# Patient Record
Sex: Female | Born: 1980 | Race: White | Hispanic: No | Marital: Married | Smoking: Never smoker
Health system: Southern US, Community
[De-identification: ages and names within clinical notes are randomized; demographics above are authoritative.]

## PROBLEM LIST (undated history)

## (undated) DIAGNOSIS — L100 Pemphigus vulgaris: Secondary | ICD-10-CM

## (undated) DIAGNOSIS — D849 Immunodeficiency, unspecified: Secondary | ICD-10-CM

## (undated) DIAGNOSIS — N289 Disorder of kidney and ureter, unspecified: Secondary | ICD-10-CM

## (undated) DIAGNOSIS — E119 Type 2 diabetes mellitus without complications: Secondary | ICD-10-CM

## (undated) HISTORY — PX: ABDOMINAL SURGERY: SHX537

## (undated) HISTORY — PX: CHOLECYSTECTOMY: SHX55

---

## 2018-12-31 ENCOUNTER — Encounter: Payer: Self-pay | Admitting: Emergency Medicine

## 2018-12-31 ENCOUNTER — Emergency Department (HOSPITAL_BASED_OUTPATIENT_CLINIC_OR_DEPARTMENT_OTHER)
Admission: EM | Admit: 2018-12-31 | Discharge: 2019-01-01 | Disposition: A | Discharge status: Home / Self Care | Payer: Self-pay | Attending: Emergency Medicine | Admitting: Emergency Medicine

## 2018-12-31 ENCOUNTER — Emergency Department (HOSPITAL_BASED_OUTPATIENT_CLINIC_OR_DEPARTMENT_OTHER): Payer: Self-pay

## 2018-12-31 DIAGNOSIS — E119 Type 2 diabetes mellitus without complications: Secondary | ICD-10-CM | POA: Insufficient documentation

## 2018-12-31 DIAGNOSIS — L03116 Cellulitis of left lower limb: Secondary | ICD-10-CM | POA: Insufficient documentation

## 2018-12-31 DIAGNOSIS — R609 Edema, unspecified: Secondary | ICD-10-CM

## 2018-12-31 DIAGNOSIS — Z79899 Other long term (current) drug therapy: Secondary | ICD-10-CM | POA: Insufficient documentation

## 2018-12-31 HISTORY — DX: Pemphigus vulgaris: L10.0

## 2018-12-31 HISTORY — DX: Type 2 diabetes mellitus without complications: E11.9

## 2018-12-31 HISTORY — DX: Immunodeficiency, unspecified: D84.9

## 2018-12-31 HISTORY — DX: Disorder of kidney and ureter, unspecified: N28.9

## 2018-12-31 LAB — CBC WITH DIFFERENTIAL/PLATELET
Abs Immature Granulocytes: 0.01 10*3/uL (ref 0.00–0.07)
Basophils Absolute: 0.1 10*3/uL (ref 0.0–0.1)
Basophils Relative: 1 %
Eosinophils Absolute: 0 10*3/uL (ref 0.0–0.5)
Eosinophils Relative: 0 %
HCT: 37 % (ref 36.0–46.0)
Hemoglobin: 12.6 g/dL (ref 12.0–15.0)
Immature Granulocytes: 0 %
Lymphocytes Relative: 38 %
Lymphs Abs: 2.6 10*3/uL (ref 0.7–4.0)
MCH: 29.6 pg (ref 26.0–34.0)
MCHC: 34.1 g/dL (ref 30.0–36.0)
MCV: 87.1 fL (ref 80.0–100.0)
Monocytes Absolute: 0.7 10*3/uL (ref 0.1–1.0)
Monocytes Relative: 10 %
Neutro Abs: 3.6 10*3/uL (ref 1.7–7.7)
Neutrophils Relative %: 51 %
Platelets: 356 10*3/uL (ref 150–400)
RBC: 4.25 MIL/uL (ref 3.87–5.11)
RDW: 12.1 % (ref 11.5–15.5)
WBC: 7 10*3/uL (ref 4.0–10.5)
nRBC: 0 % (ref 0.0–0.2)

## 2018-12-31 LAB — BASIC METABOLIC PANEL
Anion gap: 10 (ref 5–15)
BUN: 23 mg/dL — ABNORMAL HIGH (ref 6–20)
CO2: 22 mmol/L (ref 22–32)
Calcium: 8.7 mg/dL — ABNORMAL LOW (ref 8.9–10.3)
Chloride: 104 mmol/L (ref 98–111)
Creatinine, Ser: 0.76 mg/dL (ref 0.44–1.00)
GFR calc Af Amer: >60 mL/min (ref >60)
GFR calc non Af Amer: >60 mL/min (ref >60)
Glucose, Bld: 108 mg/dL — ABNORMAL HIGH (ref 70–99)
Potassium: 3.8 mmol/L (ref 3.5–5.1)
Sodium: 136 mmol/L (ref 135–145)

## 2018-12-31 MED ORDER — SULFAMETHOXAZOLE-TRIMETHOPRIM 800-160 MG PO TABS
1.0000 | ORAL_TABLET | Freq: Once | ORAL | Status: AC
  Administered 2018-12-31: 1 via ORAL
  Filled 2018-12-31: qty 1

## 2018-12-31 MED ORDER — DICLOFENAC SODIUM ER 100 MG PO TB24: 100.0000 mg | ORAL_TABLET | Freq: Every day | ORAL | 0 refills | Status: AC

## 2018-12-31 MED ORDER — ENOXAPARIN SODIUM 100 MG/ML ~~LOC~~ SOLN
1.0000 mg/kg | Freq: Once | SUBCUTANEOUS | Status: AC
  Administered 2018-12-31: 85 mg via SUBCUTANEOUS
  Filled 2018-12-31: qty 1

## 2018-12-31 MED ORDER — KETOROLAC TROMETHAMINE 30 MG/ML IJ SOLN
15.0000 mg | Freq: Once | INTRAMUSCULAR | Status: AC
  Administered 2018-12-31: 15 mg via INTRAVENOUS
  Filled 2018-12-31: qty 1

## 2018-12-31 MED ORDER — SULFAMETHOXAZOLE-TRIMETHOPRIM 800-160 MG PO TABS: 1.0000 | ORAL_TABLET | Freq: Two times a day (BID) | ORAL | 0 refills | Status: AC

## 2018-12-31 NOTE — ED Triage Notes (Addendum)
Patient states that she has a hx of cellulitis to her legs in the past - she is a diabetic. The patient states that she went to [facility] about 2 weeks ago and had a positive d - dimer and did not follow- up. The patient reports that she has had treatment for her bilateral leg swelling  - but they are swollen again. Patient has bilateral lower extremity swelling at this time. The patient denies any SOB

## 2018-12-31 NOTE — ED Notes (Signed)
X-ray at bedside

## 2019-01-01 ENCOUNTER — Encounter (HOSPITAL_BASED_OUTPATIENT_CLINIC_OR_DEPARTMENT_OTHER): Payer: Self-pay | Admitting: Emergency Medicine

## 2019-01-01 ENCOUNTER — Ambulatory Visit (HOSPITAL_BASED_OUTPATIENT_CLINIC_OR_DEPARTMENT_OTHER): Payer: Self-pay

## 2019-01-01 MED ORDER — FLUCONAZOLE 150 MG PO TABS: 150.0000 mg | ORAL_TABLET | Freq: Once | ORAL | 0 refills | Status: AC

## 2019-01-01 NOTE — ED Notes (Signed)
Rad tech at bedside to schedule pt for [place] tomorrow

## 2019-01-01 NOTE — ED Provider Notes (Signed)
[facility] EMERGENCY DEPARTMENT Provider Note   CSN: [id number] Arrival date & time: 12/31/18  2128     History   Chief Complaint Chief Complaint  Patient presents with  . Leg Swelling    HPI Brittany Chapman is a 38 y.o. female.     The history is provided by the patient.  Illness Location:  Bilateral lower extremities,  Quality:  Swelling B with redness on the dorsum of the left foot and over the left lateral malleolus Severity:  Moderate Onset quality:  Gradual Timing:  Constant Progression:  Waxing and waning Chronicity:  Recurrent Context:  Is on lasix at home.  Was seen on 11/21/18 at [facility] and told DDimer was elevated but no vascular studies were performed. Denies CP SOB, or DOE. No cough.  Relieved by:  Nothing.   Worsened by:  Unknown Ineffective treatments:  Lasix Associated symptoms: no abdominal pain, no chest pain, no congestion, no cough, no diarrhea, no fatigue, no fever, no loss of consciousness, no rhinorrhea, no shortness of breath and no wheezing   Patient with h/o peripheral edema on lasix presents with same.  Was seen in October for this and had an elevated Ddimer but no follow up DVT studies.  She has redness of the dorsum of the foot surrounding a tattoo and about the left lateral mallelous.   No trauma.  No CP, no SOB, no cough.  No f/c/r. No streaking up the legs.     Past Medical History:  Diagnosis Date  . Diabetes mellitus without complication (HCC)   . Immune deficiency disorder (HCC)   . Pemphigus vulgaris   . Renal disorder     There are no active problems to display for this patient.   Past Surgical History:  Procedure Laterality Date  . ABDOMINAL SURGERY    . CHOLECYSTECTOMY       OB History   No obstetric history on file.      Home Medications    Prior to Admission medications   Medication Sig Start Date End Date Taking? Authorizing Provider  carvedilol (COREG) 12.5 MG tablet Take by mouth.   Yes [provider]  furosemide (LASIX) 40 MG tablet Take 40 mg by mouth daily.  11/21/18  Yes [provider]  ipratropium-albuterol (DUONEB) 0.5-2.5 (3) MG/3ML SOLN Inhale into the lungs.   Yes [provider]  mycophenolate (CELLCEPT) 500 MG tablet Take 1,000 mg by mouth 2 (two) times daily.    Yes [provider]  PREDNISONE PO Take 40 mg by mouth 2 (two) times daily.    Yes [provider]  zolpidem (AMBIEN) 10 MG tablet Take by mouth. 02/10/13  Yes [provider]  Diclofenac Sodium CR 100 MG 24 hr tablet Take 1 tablet (100 mg total) by mouth daily. 12/31/18   Verle Wheeling, Deztiny, MD  fluconazole (DIFLUCAN) 150 MG tablet Take 1 tablet (150 mg total) by mouth once for 1 dose. 01/01/19 01/01/19  Rock Sobol, Melenda, MD  HYDROcodone-Chlorpheniramine 5-4 MG/5ML SOLN Take by mouth. 06/02/14   [provider]  sulfamethoxazole-trimethoprim (BACTRIM DS) 800-160 MG tablet Take 1 tablet by mouth 2 (two) times daily for 7 days. 12/31/18 01/07/19  Olyvia Gopal, Oberia, MD    Family History History reviewed. No pertinent family history.  Social History Social History   Tobacco Use  . Smoking status: Never Smoker  . Smokeless tobacco: Never Used  Substance Use Topics  . Alcohol use: Not Currently    Frequency: Never  Comment: occasional  . Drug use: Never     Allergies   Shellfish allergy   Review of Systems Review of Systems  Constitutional: Negative for fatigue and fever.  HENT: Negative for congestion and rhinorrhea.   Eyes: Negative for visual disturbance.  Respiratory: Negative for cough, shortness of breath and wheezing.   Cardiovascular: Positive for leg swelling. Negative for chest pain and palpitations.  Gastrointestinal: Negative for abdominal pain and diarrhea.  Genitourinary: Negative for difficulty urinating.  Musculoskeletal: Negative for arthralgias.  Skin: Positive for color change. Negative for wound.  Neurological: Negative for  dizziness and loss of consciousness.  Psychiatric/Behavioral: Negative for agitation.  All other systems reviewed and are negative.    Physical Exam Updated Vital Signs BP 104/78 (BP Location: Right Arm)   Pulse 90   Temp 98.7 F (37.1 C) (Oral)   Resp 18   Ht 5\' 6"  (1.676 m)   Wt 84.8 kg   LMP 12/23/2018   SpO2 100%   BMI 30.18 kg/m   Physical Exam Vitals signs and nursing note reviewed.  Constitutional:      General: She is not in acute distress.    Appearance: Normal appearance.  HENT:     Head: Normocephalic and atraumatic.     Nose: Nose normal.  Eyes:     Conjunctiva/sclera: Conjunctivae normal.     Pupils: Pupils are equal, round, and reactive to light.  Neck:     Musculoskeletal: Normal range of motion and neck supple.  Cardiovascular:     Rate and Rhythm: Normal rate and regular rhythm.     Pulses: Normal pulses.     Heart sounds: Normal heart sounds.  Pulmonary:     Effort: Pulmonary effort is normal.     Breath sounds: Normal breath sounds.  Abdominal:     General: Abdomen is flat. Bowel sounds are normal.     Tenderness: There is no abdominal tenderness. There is no guarding.  Musculoskeletal: Normal range of motion.        General: No tenderness.     Right knee: Normal.     Left knee: Normal.     Right ankle: She exhibits normal range of motion, no ecchymosis, no deformity, no laceration and normal pulse. No tenderness. Achilles tendon normal.     Left ankle: She exhibits normal range of motion, no ecchymosis, no deformity, no laceration and normal pulse. No tenderness. Achilles tendon normal.     Right lower leg: She exhibits no tenderness.     Left lower leg: She exhibits no tenderness.       Feet:     Comments: Pedal and ankle edema B, non pitting.  Soft.    Skin:    General: Skin is warm and dry.     Capillary Refill: Capillary refill takes less than 2 seconds.  Neurological:     General: No focal deficit present.     Mental Status: She is  alert and oriented to person, place, and time.  Psychiatric:        Mood and Affect: Mood normal.      ED Treatments / Results  Labs (all labs ordered are listed, but only abnormal results are displayed) Results for orders placed or performed during the hospital encounter of 12/31/18  Basic metabolic panel  Result Value Ref Range   Sodium 136 135 - 145 mmol/L   Potassium 3.8 3.5 - 5.1 mmol/L   Chloride 104 98 - 111 mmol/L   CO2 22 22 -  32 mmol/L   Glucose, Bld 108 (H) 70 - 99 mg/dL   BUN 23 (H) 6 - 20 mg/dL   Creatinine, Ser 5.40 0.44 - 1.00 mg/dL   Calcium 8.7 (L) 8.9 - 10.3 mg/dL   GFR calc non Af Amer >60 >60 mL/min   GFR calc Af Amer >60 >60 mL/min   Anion gap 10 5 - 15  CBC with Differential  Result Value Ref Range   WBC 7.0 4.0 - 10.5 K/uL   RBC 4.25 3.87 - 5.11 MIL/uL   Hemoglobin 12.6 12.0 - 15.0 g/dL   HCT 98.1 19.1 - 47.8 %   MCV 87.1 80.0 - 100.0 fL   MCH 29.6 26.0 - 34.0 pg   MCHC 34.1 30.0 - 36.0 g/dL   RDW 29.5 62.1 - 30.8 %   Platelets 356 150 - 400 K/uL   nRBC 0.0 0.0 - 0.2 %   Neutrophils Relative % 51 %   Neutro Abs 3.6 1.7 - 7.7 K/uL   Lymphocytes Relative 38 %   Lymphs Abs 2.6 0.7 - 4.0 K/uL   Monocytes Relative 10 %   Monocytes Absolute 0.7 0.1 - 1.0 K/uL   Eosinophils Relative 0 %   Eosinophils Absolute 0.0 0.0 - 0.5 K/uL   Basophils Relative 1 %   Basophils Absolute 0.1 0.0 - 0.1 K/uL   Immature Granulocytes 0 %   Abs Immature Granulocytes 0.01 0.00 - 0.07 K/uL   Dg Chest Portable 1 View  Result Date: 12/31/2018 CLINICAL DATA:  Leg swelling. EXAM: PORTABLE CHEST 1 VIEW COMPARISON:  None. FINDINGS: The cardiomediastinal contours are normal. The lungs are clear. Pulmonary vasculature is normal. No consolidation, pleural effusion, or pneumothorax. No acute osseous abnormalities are seen. IMPRESSION: Negative AP view of the chest. Electronically Signed   By: Narda Rutherford M.D.   On: 12/31/2018 23:48    Radiology Dg Chest Portable 1 View   Result Date: 12/31/2018 CLINICAL DATA:  Leg swelling. EXAM: PORTABLE CHEST 1 VIEW COMPARISON:  None. FINDINGS: The cardiomediastinal contours are normal. The lungs are clear. Pulmonary vasculature is normal. No consolidation, pleural effusion, or pneumothorax. No acute osseous abnormalities are seen. IMPRESSION: Negative AP view of the chest. Electronically Signed   By: Narda Rutherford M.D.   On: 12/31/2018 23:48    Procedures Procedures (including critical care time)  Medications Ordered in ED Medications  sulfamethoxazole-trimethoprim (BACTRIM DS) 800-160 MG per tablet 1 tablet (1 tablet Oral Given 12/31/18 2318)  enoxaparin (LOVENOX) injection 85 mg (85 mg Subcutaneous Given 12/31/18 2319)  ketorolac (TORADOL) 30 MG/ML injection 15 mg (15 mg Intravenous Given 12/31/18 2319)     Initial Impression / Assessment and Plan / ED Course  I am treating this patient for cellulitis.  Symptoms are mild but patient states she has a history of this and given the tattoo in that area I believe this is prudent.  Bactrim will cover for skin pathogens and is highly effective against MRSA.  Given the previously elevated D-dimer without follow up dopplers of the lower extremities I have given a dose of lovenox and have ordered dopplers of both lower extremities for the am. I do not believe this patient has a PE and the patient denies all symptoms of a PE and has normal respiratory rate and normal oxygen saturation and vitals signs.  This edema appears to be dependent in nature as it is non pitting and home lasix is not helping. Patient needs compression hose to the waist, I have written  a prescription for this. No signs of heart failure on chest xray nor renal dysfunction on lab work.  Patient is instruction to follow up with her family doctor for ongoing care.    Brittany Chapman was evaluated in Emergency Department on 01/01/2019 for the symptoms described in the history of present illness. She was evaluated in the  context of the global COVID-19 pandemic, which necessitated consideration that the patient might be at risk for infection with the SARS-CoV-2 virus that causes COVID-19. Institutional protocols and algorithms that pertain to the evaluation of patients at risk for COVID-19 are in a state of rapid change based on information released by regulatory bodies including the CDC and federal and state organizations. These policies and algorithms were followed during the patient's care in the ED.   Patient requested a prescription for anti fungal given that she is receiving antibiotics.  I have also prescribed this medication at the patient's request.      Final Clinical Impressions(s) / ED Diagnoses   Final diagnoses:  Cellulitis of left lower extremity   Return for intractable cough, coughing up blood,fevers >100.4 unrelieved by medication, shortness of breath, intractable vomiting, chest pain, shortness of breath, weakness,numbness, changes in speech, facial asymmetry,abdominal pain, passing out,Inability to tolerate liquids or food, cough, altered mental status or any concerns. No signs of systemic illness or infection. The patient is nontoxic-appearing on exam and vital signs are within normal limits.   I have reviewed the triage vital signs and the nursing notes. Pertinent labs &imaging results that were available during my care of the patient were reviewed by me and considered in my medical decision making (see chart for details).  After history, exam, and medical workup I feel the patient has been appropriately medically screened and is safe for discharge home. Pertinent diagnoses were discussed with the patient. Patient was given return precautions ED Discharge Orders         Ordered    fluconazole (DIFLUCAN) 150 MG tablet   Once     01/01/19 0022    sulfamethoxazole-trimethoprim (BACTRIM DS) 800-160 MG tablet  2 times daily     12/31/18 2341    Diclofenac Sodium CR 100 MG 24 hr  tablet  Daily     12/31/18 2341    US Venous Img Lower Unilateral Left  Status:  Canceled     12/31/18 2341    US Venous Img Lower Bilateral (DVT)     12/31/18 2341           Ryder Man, Jena, MD 01/01/19 3220

## 2019-01-01 NOTE — ED Notes (Signed)
Pt requested diflucan at d/c MD aware and orders received.

## 2019-01-07 ENCOUNTER — Emergency Department (HOSPITAL_BASED_OUTPATIENT_CLINIC_OR_DEPARTMENT_OTHER): Payer: Self-pay

## 2019-01-07 ENCOUNTER — Emergency Department (HOSPITAL_BASED_OUTPATIENT_CLINIC_OR_DEPARTMENT_OTHER)
Admission: EM | Admit: 2019-01-07 | Discharge: 2019-01-07 | Disposition: A | Discharge status: Home / Self Care | Payer: Self-pay | Attending: Emergency Medicine | Admitting: Emergency Medicine

## 2019-01-07 ENCOUNTER — Encounter (HOSPITAL_BASED_OUTPATIENT_CLINIC_OR_DEPARTMENT_OTHER): Payer: Self-pay | Admitting: Emergency Medicine

## 2019-01-07 ENCOUNTER — Other Ambulatory Visit: Payer: Self-pay

## 2019-01-07 DIAGNOSIS — R2243 Localized swelling, mass and lump, lower limb, bilateral: Secondary | ICD-10-CM | POA: Insufficient documentation

## 2019-01-07 DIAGNOSIS — E119 Type 2 diabetes mellitus without complications: Secondary | ICD-10-CM | POA: Insufficient documentation

## 2019-01-07 DIAGNOSIS — Z91013 Allergy to seafood: Secondary | ICD-10-CM | POA: Insufficient documentation

## 2019-01-07 DIAGNOSIS — Z79899 Other long term (current) drug therapy: Secondary | ICD-10-CM | POA: Insufficient documentation

## 2019-01-07 DIAGNOSIS — I509 Heart failure, unspecified: Secondary | ICD-10-CM

## 2019-01-07 DIAGNOSIS — M7989 Other specified soft tissue disorders: Secondary | ICD-10-CM

## 2019-01-07 DIAGNOSIS — Z7984 Long term (current) use of oral hypoglycemic drugs: Secondary | ICD-10-CM | POA: Insufficient documentation

## 2019-01-07 LAB — COMPREHENSIVE METABOLIC PANEL
ALT: 32 U/L (ref 0–44)
AST: 24 U/L (ref 15–41)
Albumin: 4 g/dL (ref 3.5–5.0)
Alkaline Phosphatase: 97 U/L (ref 38–126)
Anion gap: 9 (ref 5–15)
BUN: 15 mg/dL (ref 6–20)
CO2: 24 mmol/L (ref 22–32)
Calcium: 9 mg/dL (ref 8.9–10.3)
Chloride: 106 mmol/L (ref 98–111)
Creatinine, Ser: 0.7 mg/dL (ref 0.44–1.00)
GFR calc Af Amer: >60 mL/min (ref >60)
GFR calc non Af Amer: >60 mL/min (ref >60)
Glucose, Bld: 96 mg/dL (ref 70–99)
Potassium: 3.4 mmol/L — ABNORMAL LOW (ref 3.5–5.1)
Sodium: 139 mmol/L (ref 135–145)
Total Bilirubin: 0.9 mg/dL (ref 0.3–1.2)
Total Protein: 7.3 g/dL (ref 6.5–8.1)

## 2019-01-07 LAB — CBC WITH DIFFERENTIAL/PLATELET
Abs Immature Granulocytes: 0.02 10*3/uL (ref 0.00–0.07)
Basophils Absolute: 0 10*3/uL (ref 0.0–0.1)
Basophils Relative: 1 %
Eosinophils Absolute: 0 10*3/uL (ref 0.0–0.5)
Eosinophils Relative: 0 %
HCT: 38.1 % (ref 36.0–46.0)
Hemoglobin: 12.9 g/dL (ref 12.0–15.0)
Immature Granulocytes: 0 %
Lymphocytes Relative: 26 %
Lymphs Abs: 2.1 10*3/uL (ref 0.7–4.0)
MCH: 29.9 pg (ref 26.0–34.0)
MCHC: 33.9 g/dL (ref 30.0–36.0)
MCV: 88.2 fL (ref 80.0–100.0)
Monocytes Absolute: 0.7 10*3/uL (ref 0.1–1.0)
Monocytes Relative: 9 %
Neutro Abs: 5.1 10*3/uL (ref 1.7–7.7)
Neutrophils Relative %: 64 %
Platelets: 376 10*3/uL (ref 150–400)
RBC: 4.32 MIL/uL (ref 3.87–5.11)
RDW: 12.2 % (ref 11.5–15.5)
WBC: 7.9 10*3/uL (ref 4.0–10.5)
nRBC: 0 % (ref 0.0–0.2)

## 2019-01-07 LAB — URINALYSIS, ROUTINE W REFLEX MICROSCOPIC
Bilirubin Urine: NEGATIVE
Glucose, UA: NEGATIVE mg/dL
Hgb urine dipstick: NEGATIVE
Ketones, ur: 40 mg/dL — AB
Nitrite: NEGATIVE
Protein, ur: NEGATIVE mg/dL
Specific Gravity, Urine: 1.025 (ref 1.005–1.030)
pH: 7 (ref 5.0–8.0)

## 2019-01-07 LAB — URINALYSIS, MICROSCOPIC (REFLEX)

## 2019-01-07 LAB — BRAIN NATRIURETIC PEPTIDE: B Natriuretic Peptide: 23 pg/mL (ref 0.0–100.0)

## 2019-01-07 LAB — PREGNANCY, URINE: Preg Test, Ur: NEGATIVE

## 2019-01-07 MED ORDER — KETOROLAC TROMETHAMINE 15 MG/ML IJ SOLN
15.0000 mg | Freq: Once | INTRAMUSCULAR | Status: AC
  Administered 2019-01-07: 15 mg via INTRAMUSCULAR
  Filled 2019-01-07: qty 1

## 2019-01-07 MED ORDER — METHOCARBAMOL 500 MG PO TABS: 500.0000 mg | ORAL_TABLET | Freq: Two times a day (BID) | ORAL | 0 refills | Status: AC

## 2019-01-07 NOTE — ED Triage Notes (Signed)
Pt here with bilateral leg swelling that has gotten worse despite fluid restrictions and lasix adjustment.

## 2019-01-07 NOTE — ED Notes (Signed)
Attempted IV x 2 without success; another RN will attempt with [place], but that RN is in another procedure at this time. EDP aware.

## 2019-01-07 NOTE — ED Notes (Signed)
Patient transported to Ultrasound 

## 2019-01-07 NOTE — ED Notes (Signed)
Pt unable to provide urine at this time

## 2019-01-07 NOTE — Discharge Instructions (Addendum)
You have been diagnosed today with Leg Swelling.  At this time there does not appear to be the presence of an emergent medical condition, however there is always the potential for conditions to change. Please read and follow the below instructions.  Please return to the Emergency Department immediately for any new or worsening symptoms. Please be sure to follow up with your Primary Care Provider within one week regarding your visit today; please call their office to schedule an appointment even if you are feeling better for a follow-up visit.  You may call the [place] [facility] to schedule a follow-up appointment with a primary care provider if you do not already have one. You may use the muscle relaxer Robaxin as prescribed to help with your symptoms.  Do not drive or operate heavy machinery while taking Robaxin as it will make you drowsy.  Do not drink alcohol or take other sedating medications while taking Robaxin as this will worsen side effects. You have been given an NSAID-containing medication called Toradol today.  Do not take the medications including ibuprofen, Aleve, Advil, naproxen or other NSAID-containing medications for the next 2 days.  Please be sure to drink plenty of water over the next few days. Please use compression stockings and elevate your legs to help with your symptoms.  Get help right away if: You have shortness of breath or chest pain. You cannot breathe when you lie down. You have pain, redness, or warmth in the swollen areas. You have heart, liver, or kidney disease and get edema all of a sudden. You have a fever and your symptoms get worse all of a sudden. You have any new/concerning or worsening symptoms  Please read the additional information packets attached to your discharge summary.  Do not take your medicine if  develop an itchy rash, swelling in your mouth or lips, or difficulty breathing; call 911 and seek immediate emergency  medical attention if this occurs.  Note: Portions of this text may have been transcribed using voice recognition software. Every effort was made to ensure accuracy; however, inadvertent computerized transcription errors may still be present.

## 2019-01-07 NOTE — ED Provider Notes (Signed)
[facility] EMERGENCY DEPARTMENT Provider Note   CSN: [id number] Arrival date & time: 01/07/19  1203     History   Chief Complaint Chief Complaint  Patient presents with   Foot Swelling    HPI Brittany Chapman is a 38 y.o. female history of diabetes, immunodeficiency disorder, pemphigus vulgaris.  Patient presents today for chronic bilateral lower extremity swelling that has been worsening for 1 week.  She reports swelling has gradually worsened she reports that she has had pain to the bottoms of her feet since swelling began she describes a sharp throbbing sensation constant worsened with ambulation improved with rest and elevation and nonradiating.  Patient is concerned for heart failure, she has never been diagnosed with heart failure she would like to be evaluated for this today.  She reports that she has been taking Lasix 40 mg daily as well as her regular carvedilol.  She reports that these have not been helping her symptoms.  Patient was seen in this ER on 12/31/2018, at that time she was treated with Bactrim for concern of cellulitis, given a dose of Lovenox and scheduled for ultrasounds the following morning to evaluate for DVT.  Patient did not show up for her DVT studies.  Patient reports compliance with her Bactrim medication but reports symptoms have not improved.  Denies fever/chills, headache, chest pain/shortness of breath, abdominal pain, nausea/vomiting, hemoptysis, diarrhea, fall/injury, skin break, numbness/weakness, tingling or any additional concerns today.  Of note patient is requesting Toradol as she reports is the only medication that works for her leg pain.  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    HPI  Past Medical History:  Diagnosis Date   Diabetes mellitus without complication ([place])    Immune deficiency disorder ([place])    Pemphigus vulgaris    Renal disorder     There are  no active problems to display for this patient.   Past Surgical History:  Procedure Laterality Date   ABDOMINAL SURGERY     CHOLECYSTECTOMY       OB History   No obstetric history on file.      Home Medications    Prior to Admission medications   Medication Sig Start Date End Date Taking? Authorizing Provider  carvedilol (COREG) 12.5 MG tablet Take by mouth.    [provider]  Diclofenac Sodium CR 100 MG 24 hr tablet Take 1 tablet (100 mg total) by mouth daily. 12/31/18   Palumbo, Kanya, MD  furosemide (LASIX) 40 MG tablet Take 40 mg by mouth daily.  11/21/18   [provider]  HYDROcodone-Chlorpheniramine 5-4 MG/5ML SOLN Take by mouth. 06/02/14   [provider]  ipratropium-albuterol (DUONEB) 0.5-2.5 (3) MG/3ML SOLN Inhale into the lungs.    [provider]  methocarbamol (ROBAXIN) 500 MG tablet Take 1 tablet (500 mg total) by mouth 2 (two) times daily. 01/07/19   Nuala Alpha A, PA-C  mycophenolate (CELLCEPT) 500 MG tablet Take 1,000 mg by mouth 2 (two) times daily.     [provider]  PREDNISONE PO Take 40 mg by mouth 2 (two) times daily.     [provider]  sulfamethoxazole-trimethoprim (BACTRIM DS) 800-160 MG tablet Take 1 tablet by mouth 2 (two) times daily for 7 days. 12/31/18 01/07/19  Palumbo, Susie, MD  zolpidem (AMBIEN) 10 MG tablet Take by mouth. 02/10/13   [provider]    Family History History reviewed. No pertinent  family history.  Social History Social History   Tobacco Use   Smoking status: Never Smoker   Smokeless tobacco: Never Used  Substance Use Topics   Alcohol use: Not Currently    Frequency: Never    Comment: occasional   Drug use: Never     Allergies   Shellfish allergy   Review of Systems Review of Systems Ten systems are reviewed and are negative for acute change except as noted in the HPI   Physical Exam Updated Vital Signs BP 128/73 (BP Location: Right  Arm)    Pulse 86    Temp 98.7 F (37.1 C) (Oral)    Resp 15    LMP 12/23/2018    SpO2 100%   Physical Exam Constitutional:      General: She is not in acute distress.    Appearance: Normal appearance. She is well-developed. She is not ill-appearing or diaphoretic.  HENT:     Head: Normocephalic and atraumatic.     Right Ear: External ear normal.     Left Ear: External ear normal.     Nose: Nose normal.  Eyes:     General: Vision grossly intact. Gaze aligned appropriately.     Pupils: Pupils are equal, round, and reactive to light.  Neck:     Musculoskeletal: Normal range of motion.     Trachea: Trachea and phonation normal. No tracheal deviation.  Cardiovascular:     Rate and Rhythm: Normal rate and regular rhythm.     Pulses:          Dorsalis pedis pulses are 2+ on the right side and 2+ on the left side.     Heart sounds: Normal heart sounds.  Pulmonary:     Effort: Pulmonary effort is normal. No respiratory distress.  Abdominal:     General: There is no distension.     Palpations: Abdomen is soft.     Tenderness: There is no abdominal tenderness. There is no guarding or rebound.  Musculoskeletal: Normal range of motion.     Right lower leg: 1+ Edema present.     Left lower leg: 1+ Edema present.     Comments: No midline C/T/L spinal tenderness to palpation, no paraspinal muscle tenderness, no deformity, crepitus, or step-off noted. No sign of injury to the neck or back. - All major joints mobilized with appropriate range of motion and strength.  Feet:     Right foot:     Protective Sensation: 3 sites tested. 3 sites sensed.     Left foot:     Protective Sensation: 3 sites tested. 3 sites sensed.  Skin:    General: Skin is warm and dry.  Neurological:     Mental Status: She is alert.     GCS: GCS eye subscore is 4. GCS verbal subscore is 5. GCS motor subscore is 6.     Comments: Speech is clear and goal oriented, follows commands Major Cranial nerves without deficit,  no facial droop Normal strength in upper and lower extremities bilaterally including dorsiflexion and plantar flexion, strong and equal grip strength Sensation normal to light and sharp touch Moves extremities without ataxia, coordination intact Normal gait  Psychiatric:        Behavior: Behavior normal.    ED Treatments / Results  Labs (all labs ordered are listed, but only abnormal results are displayed) Labs Reviewed  COMPREHENSIVE METABOLIC PANEL - Abnormal; Notable for the following components:      Result Value   Potassium  3.4 (*)    All other components within normal limits  URINALYSIS, ROUTINE W REFLEX MICROSCOPIC - Abnormal; Notable for the following components:   APPearance CLOUDY (*)    Ketones, ur 40 (*)    Leukocytes,Ua LARGE (*)    All other components within normal limits  URINALYSIS, MICROSCOPIC (REFLEX) - Abnormal; Notable for the following components:   Bacteria, UA MANY (*)    All other components within normal limits  CBC WITH DIFFERENTIAL/PLATELET  BRAIN NATRIURETIC PEPTIDE  PREGNANCY, URINE    EKG EKG Interpretation  Date/Time:  Saturday January 07 2019 15:13:16 EST Ventricular Rate:  79 PR Interval:    QRS Duration: 98 QT Interval:  412 QTC Calculation: 473 R Axis:   105 Text Interpretation: Sinus rhythm Atrial premature complex Short PR interval Borderline right axis deviation Abnormal T, consider ischemia, lateral leads No old tracing to compare Confirmed by Rolan Bucco [phone]) on 01/07/2019 5:45:46 PM   Radiology Dg Chest 2 View  Result Date: 01/07/2019 CLINICAL DATA:  Pt with swelling bilateral legs x 2 weeks; nonsmoker; no c/o chest problems; no SOB; no cough; no chest pain EXAM: CHEST - 2 VIEW COMPARISON:  12/31/2018 FINDINGS: Lungs are clear. Heart size and mediastinal contours are within normal limits. No effusion. Visualized bones unremarkable. IMPRESSION: No acute cardiopulmonary disease. Electronically Signed   By: Corlis Leak M.D.    On: 01/07/2019 14:29   US Venous Img Lower Bilateral (dvt)  Result Date: 01/07/2019 CLINICAL DATA:  Leg swelling and pain x3 weeks EXAM: BILATERAL LOWER EXTREMITY VENOUS DOPPLER ULTRASOUND TECHNIQUE: Gray-scale sonography with compression, as well as color and duplex ultrasound, were performed to evaluate the deep venous system from the level of the common femoral vein through the popliteal and proximal calf veins. COMPARISON:  None FINDINGS: Normal compressibility of the common femoral, superficial femoral, and popliteal veins, as well as the proximal calf veins. No filling defects to suggest DVT on grayscale or color Doppler imaging. Doppler waveforms show normal direction of venous flow, normal respiratory phasicity and response to augmentation. Visualized segments of the saphenous venous systems normal in caliber and compressibility. Subcutaneous fat is heterogenous with patchy echogenic foci, nonspecific. IMPRESSION: No femoropopliteal and no calf DVT in the visualized calf veins. If clinical symptoms are inconsistent or if there are persistent or worsening symptoms, further imaging (possibly involving the iliac veins) may be warranted. Electronically Signed   By: Corlis Leak M.D.   On: 01/07/2019 14:23    Procedures Procedures (including critical care time)  Medications Ordered in ED Medications  ketorolac (TORADOL) 15 MG/ML injection 15 mg (15 mg Intramuscular Given 01/07/19 1543)     Initial Impression / Assessment and Plan / ED Course  I have reviewed the triage vital signs and the nursing notes.  Pertinent labs & imaging results that were available during my care of the patient were reviewed by me and considered in my medical decision making (see chart for details).    Well-appearing no acute distress, cranial nerves intact, no neuro deficits, no meningeal signs, heart regular rate and rhythm without murmur, lungs clear to auscultation bilaterally, abdomen soft nontender without  peritoneal signs, 1+ bilateral lower extremity edema equal without color change.  No skin break or sign of cellulitis.  Neurovascular intact to bilateral lower extremities, equal pedal pulses, sensation and capillary refill intact to all toes.  Range of motion intact without pain and with appropriate strength.  Patient had DVT study scheduled last week but did not show  for the appointment, will reorder today.  Additionally will obtain CBC, CMP, BNP, urinalysis and chest x-ray. - CBC within normal limits CMP potassium 3.4 otherwise within normal limits BNP within normal limit Urinalysis without protein, 40 ketones, many squamous cells, white blood cells, bacteria.  Suspect contaminated catch, patient without urinary symptoms no indication for ABX at this time. Pregnancy test negative CXR:    IMPRESSION:  No acute cardiopulmonary disease.   DVT US Bilateral:  IMPRESSION:  No femoropopliteal and no calf DVT in the visualized calf veins. If  clinical symptoms are inconsistent or if there are persistent or  worsening symptoms, further imaging (possibly involving the iliac  veins) may be warranted.   EKG: Sinus rhythm Atrial premature complex Short PR interval Borderline right axis deviation Abnormal T, consider ischemia, lateral leads No old tracing to compare Confirmed by Rolan BuccoBelfi, Melanie [phone] on 01/07/2019 5:45:46 PM - Patient has been ambulating in this emergency department without assistance or difficulty.  She is given 15 mg Toradol intramuscular per her request and reports great improvement of her symptoms.  Reassuring work-up as above, no evidence of heart failure today, patient has been encouraged to continue using compression hose and elevation to help with her symptoms.  She will be given referral to local PCP to establish care and for further evaluation.  There is no evidence for this to be DVT today, do not feel further imaging of the veins is warranted at this time.  She is  neurovascular intact bilateral lower extremities without evidence of infection, compartment syndrome or other acute pathologies.  She is well-appearing in no acute distress vital signs within normal limits, she is agreeable to discharge with outpatient PCP follow-up.  Patient has been given Robaxin for possible muscular etiology of her symptoms, she states of muscle extra precautions.  At this time there does not appear to be any evidence of an acute emergency medical condition and the patient appears stable for discharge with appropriate outpatient follow up. Diagnosis was discussed with patient who verbalizes understanding of care plan and is agreeable to discharge. I have discussed return precautions with patient who verbalizes understanding of return precautions. Patient encouraged to follow-up with their PCP. All questions answered.  Patient has been discharged in good condition.  Patient's case discussed with Dr. Fredderick PhenixBelfi who agrees with plan to discharge with PCP follow-up.   Note: Portions of this report may have been transcribed using voice recognition software. Every effort was made to ensure accuracy; however, inadvertent computerized transcription errors may still be present. Final Clinical Impressions(s) / ED Diagnoses   Final diagnoses:  Leg swelling    ED Discharge Orders         Ordered    methocarbamol (ROBAXIN) 500 MG tablet  2 times daily     01/07/19 1744           Elizabeth PalauMorelli, Iveliz Garay A, PA-C 01/07/19 1753    Rolan BuccoBelfi, Melanie, MD 01/07/19 2332

## 2019-01-09 LAB — URINE CULTURE

## 2020-06-04 IMAGING — CR DG CHEST 2V
2 series · 2 of 2 positions shown · non-contrast
Comparison: 12/31/2018

CLINICAL DATA: Pt with swelling bilateral legs x 2 weeks;
nonsmoker; no c/o chest problems; no SOB; no cough; no chest pain

EXAM:
CHEST - 2 VIEW

[w chest pa]
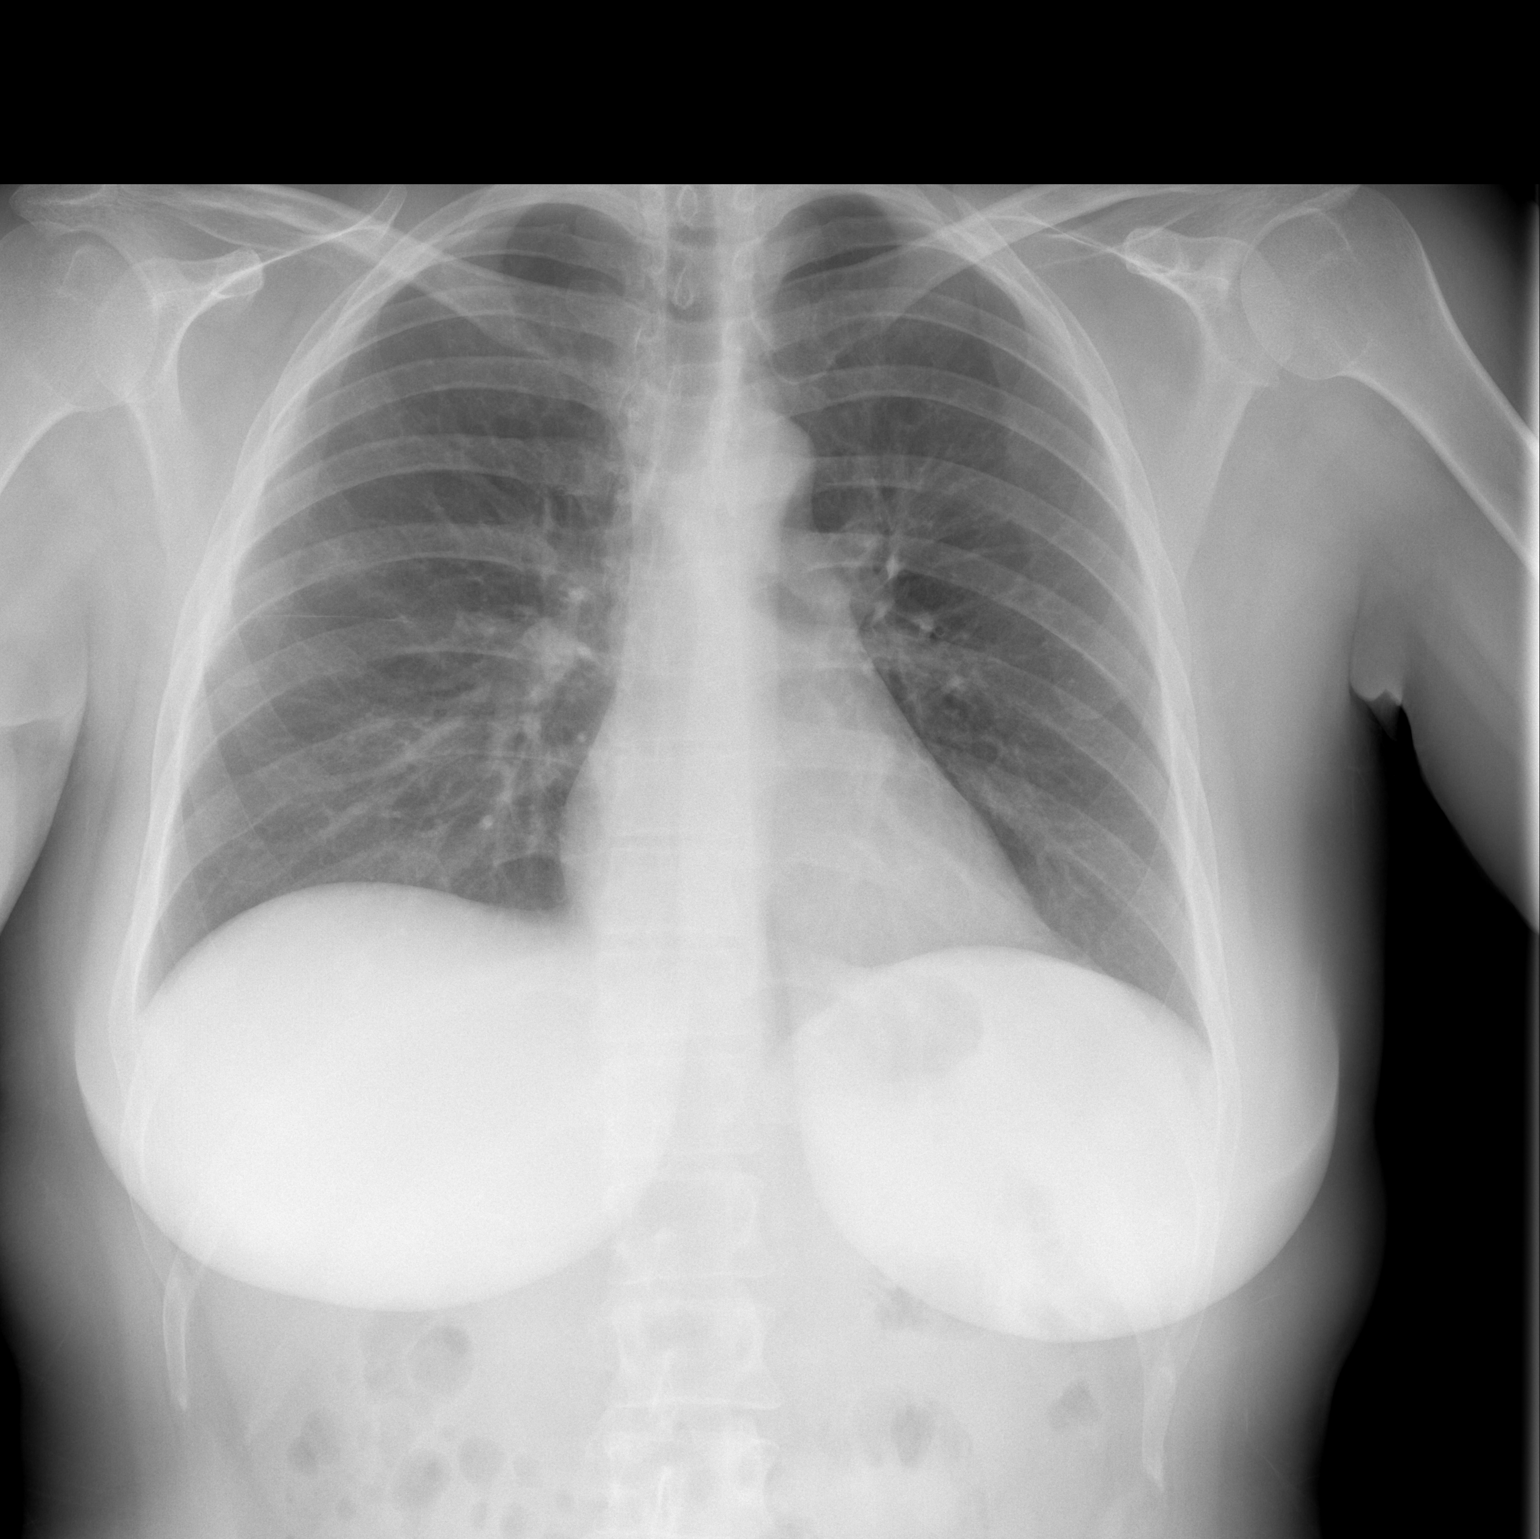

[w chest lat]
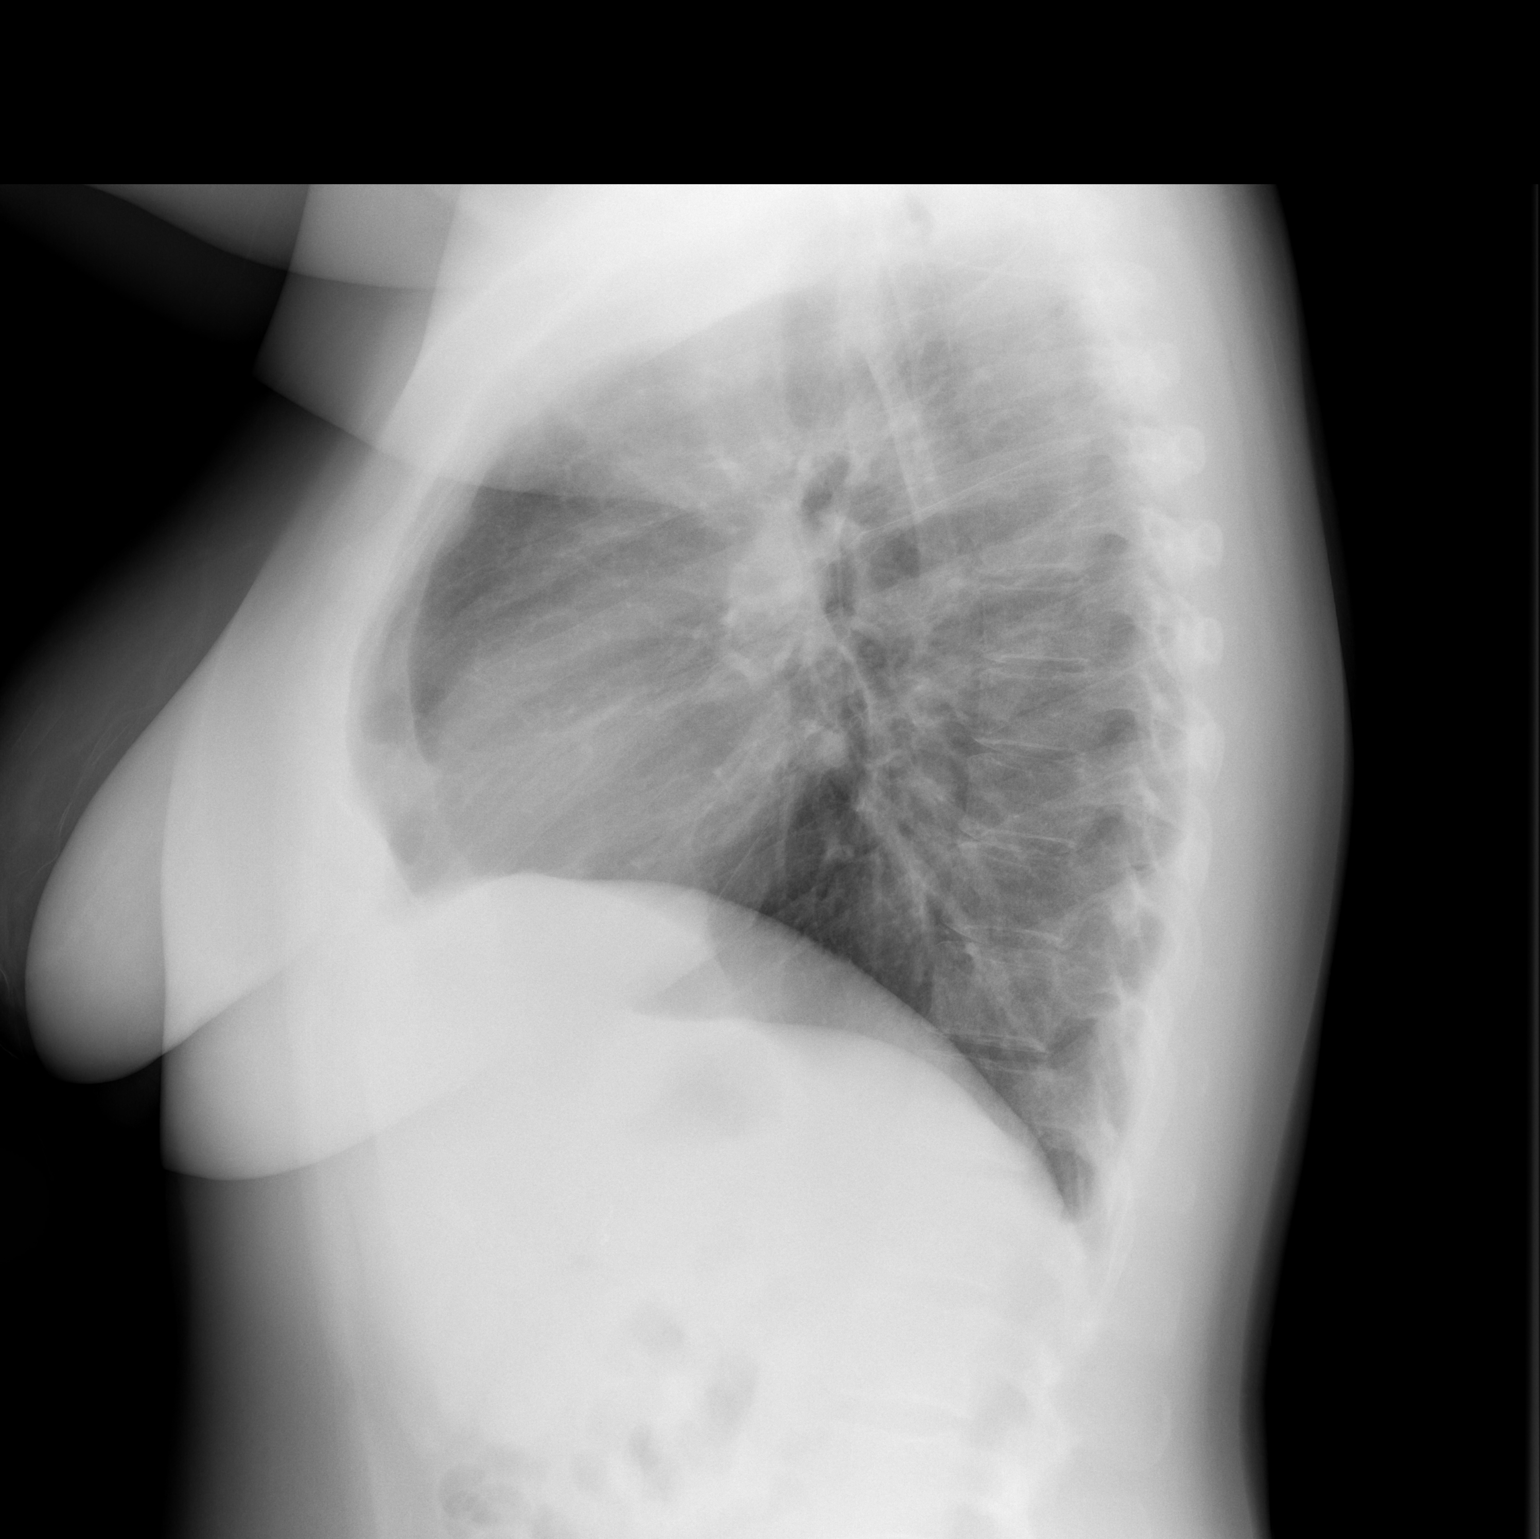

[2 of 2 positions shown; findings below may reference images not displayed]

FINDINGS: Lungs are clear.

Heart size and mediastinal contours are within normal limits.

No effusion.

Visualized bones unremarkable.
IMPRESSION: No acute cardiopulmonary disease.

## 2024-01-31 ENCOUNTER — Emergency Department (HOSPITAL_BASED_OUTPATIENT_CLINIC_OR_DEPARTMENT_OTHER)
Admission: EM | Admit: 2024-01-31 | Discharge: 2024-01-31 | Disposition: A | Discharge status: Home / Self Care | Attending: Emergency Medicine | Admitting: Emergency Medicine

## 2024-01-31 ENCOUNTER — Other Ambulatory Visit: Payer: Self-pay

## 2024-01-31 ENCOUNTER — Encounter (HOSPITAL_BASED_OUTPATIENT_CLINIC_OR_DEPARTMENT_OTHER): Payer: Self-pay

## 2024-01-31 DIAGNOSIS — T148XXA Other injury of unspecified body region, initial encounter: Secondary | ICD-10-CM

## 2024-01-31 DIAGNOSIS — S01511A Laceration without foreign body of lip, initial encounter: Secondary | ICD-10-CM | POA: Insufficient documentation

## 2024-01-31 DIAGNOSIS — W540XXA Bitten by dog, initial encounter: Secondary | ICD-10-CM | POA: Insufficient documentation

## 2024-01-31 DIAGNOSIS — S01551A Open bite of lip, initial encounter: Secondary | ICD-10-CM | POA: Diagnosis present

## 2024-01-31 MED ORDER — LIDOCAINE-EPINEPHRINE-TETRACAINE (LET) TOPICAL GEL
3.0000 mL | Freq: Once | TOPICAL | Status: AC
  Administered 2024-01-31: 3 mL via TOPICAL
  Filled 2024-01-31: qty 3

## 2024-01-31 MED ORDER — AMOXICILLIN-POT CLAVULANATE 875-125 MG PO TABS: 1.0000 | ORAL_TABLET | Freq: Two times a day (BID) | ORAL | 0 refills | Status: AC

## 2024-01-31 MED ORDER — FLUCONAZOLE 200 MG PO TABS: 200.0000 mg | ORAL_TABLET | Freq: Every day | ORAL | 0 refills | Status: AC

## 2024-01-31 MED ORDER — LIDOCAINE-EPINEPHRINE (PF) 2 %-1:200000 IJ SOLN
20.0000 mL | Freq: Once | INTRAMUSCULAR | Status: AC
  Administered 2024-01-31: 20 mL
  Filled 2024-01-31: qty 20
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
## 2024-01-31 NOTE — ED Triage Notes (Signed)
 Pt states that she is fostering a dog and the dog bit her lip. States that dog is up-to date with rabies shot.

## 2024-01-31 NOTE — ED Notes (Signed)
 Discharge instructions reviewed with patient. Patient verbalizes understanding, no further questions at this time. Medications/prescriptions and follow up information provided. No acute distress noted at time of departure.
